# Patient Record
Sex: Male | Born: 1975 | Race: White | Hispanic: No | Marital: Married | Smoking: Never smoker
Health system: Southern US, Community
[De-identification: ages and names within clinical notes are randomized; demographics above are authoritative.]

## PROBLEM LIST (undated history)

## (undated) DIAGNOSIS — T783XXA Angioneurotic edema, initial encounter: Secondary | ICD-10-CM

## (undated) HISTORY — PX: BACK SURGERY: SHX140

## (undated) HISTORY — DX: Angioneurotic edema, initial encounter: T78.3XXA

---

## 2005-10-25 ENCOUNTER — Ambulatory Visit: Payer: Self-pay | Admitting: Family Medicine

## 2008-06-04 ENCOUNTER — Ambulatory Visit: Payer: Self-pay | Admitting: Family Medicine

## 2008-06-30 ENCOUNTER — Encounter (INDEPENDENT_AMBULATORY_CARE_PROVIDER_SITE_OTHER): Payer: Self-pay | Admitting: Neurosurgery

## 2008-06-30 ENCOUNTER — Inpatient Hospital Stay (HOSPITAL_COMMUNITY): Admission: RE | Admit: 2008-06-30 | Discharge: 2008-07-04 | Payer: Self-pay | Admitting: Neurosurgery

## 2008-07-24 ENCOUNTER — Ambulatory Visit: Payer: Self-pay | Admitting: Neurosurgery

## 2008-07-25 ENCOUNTER — Ambulatory Visit: Payer: Self-pay | Admitting: Neurosurgery

## 2008-12-25 ENCOUNTER — Ambulatory Visit: Payer: Self-pay | Admitting: Neurosurgery

## 2008-12-26 ENCOUNTER — Ambulatory Visit: Payer: Self-pay | Admitting: Neurosurgery

## 2009-05-11 ENCOUNTER — Ambulatory Visit: Payer: Self-pay | Admitting: Internal Medicine

## 2009-12-03 ENCOUNTER — Ambulatory Visit: Payer: Self-pay | Admitting: Family Medicine

## 2009-12-14 ENCOUNTER — Ambulatory Visit: Payer: Self-pay | Admitting: Neurosurgery

## 2009-12-17 ENCOUNTER — Ambulatory Visit: Payer: Self-pay | Admitting: Neurosurgery

## 2010-02-08 ENCOUNTER — Ambulatory Visit: Payer: Self-pay | Admitting: Rheumatology

## 2010-05-17 ENCOUNTER — Ambulatory Visit: Payer: Self-pay | Admitting: Neurosurgery

## 2010-05-31 ENCOUNTER — Ambulatory Visit (HOSPITAL_COMMUNITY): Admission: RE | Admit: 2010-05-31 | Discharge: 2010-06-01 | Payer: Self-pay | Admitting: Neurosurgery

## 2010-06-04 ENCOUNTER — Emergency Department: Payer: Self-pay | Admitting: Emergency Medicine

## 2010-09-17 ENCOUNTER — Ambulatory Visit: Payer: Self-pay | Admitting: Orthopedic Surgery

## 2010-09-28 ENCOUNTER — Ambulatory Visit: Payer: Self-pay | Admitting: Internal Medicine

## 2010-11-18 ENCOUNTER — Ambulatory Visit: Payer: Self-pay | Admitting: Neurosurgery

## 2010-11-20 ENCOUNTER — Encounter: Payer: Self-pay | Admitting: Orthopedic Surgery

## 2011-01-15 LAB — CBC
MCH: 30.2 pg (ref 26.0–34.0)
MCV: 89.2 fL (ref 78.0–100.0)
Platelets: 238 10*3/uL (ref 150–400)
RDW: 12.1 % (ref 11.5–15.5)

## 2011-01-15 LAB — TYPE AND SCREEN
ABO/RH(D): O POS
Antibody Screen: NEGATIVE

## 2011-01-15 LAB — DIFFERENTIAL
Basophils Absolute: 0.1 10*3/uL (ref 0.0–0.1)
Eosinophils Absolute: 0 10*3/uL (ref 0.0–0.7)
Eosinophils Relative: 0 % (ref 0–5)

## 2011-03-15 NOTE — Op Note (Signed)
Norman Thompson, Norman Thompson                  ACCOUNT NO.:  [id number]   MEDICAL RECORD NO.:  [id number]          PATIENT TYPE:  OIB   LOCATION:  3020                         FACILITY:  [facility]   PHYSICIAN:  Kathaleen Maser. Pool, M.D.    DATE OF BIRTH:  [birth year]   DATE OF PROCEDURE:  06/30/2008  DATE OF DISCHARGE:                               OPERATIVE REPORT   PREOPERATIVE DIAGNOSIS:  L2 intradural nerve sheath tumor.   POSTOPERATIVE DIAGNOSIS:  L2 intradural nerve sheath tumor.   PROCEDURE NAME:  L1-L2 laminectomy with resection of intradural  neoplasm, microdissection.   SURGEON:  Kathaleen Maser. Pool, MD   ASSISTANT:  Donalee Citrin, MD   ANESTHESIA:  General endotracheal.   INDICATIONS:  [name] is a 35 year old male with a history of a  previous lumbar nerve sheath tumor resection done approximately 8 years  ago in outside hospital.  The patient has been studied with followup  scans, which have all been free from any recurrent disease.  Over the  past 6 months, the patient has had some intermittent pain into his left  groin, particularly with turning towards the right.  This intermittently  has caused his left leg to go away and caused him to fall.  Recent  repeat studies with an MRI scan demonstrates a new appearance of an L2  nerve sheath tumor extending into the proximal aspect of the nerve root  sleeve.  This was felt to be consistent with a nerve sheath tumor and we  discussed options and decided to proceed with surgical resection.   OPERATIVE NOTE:  The patient was placed on the operative table in a  supine position.  After adequate level of anesthesia was achieved, the  patient was prone onto Wilson frame, firmly padded, the patient's lumbar  region was prepped and draped sterilely.  A #10 blade was used to make a  curvilinear skin incision overlying the L1-L2 interspace where this was  carried down sharply in midline.  A subperiosteal dissection was then  performed exposing the lamina and  facet joints of L1 and L2.  Deep self-  retaining retractor was placed.  Intraoperative x-ray was taken and the  level was confirmed.  Decompressive laminectomy was then performed using  Leksell rongeurs, Kerrison rongeurs, and high-speed drill to remove the  inferior half of the lamina of L1 and the entire L2 lamina.  Ligament  flavum was then elevated and resected in a piecemeal fashion using  Kerrison rongeurs.  Microscope was then brought into the field.  Using  microdissection of the cauda equina and nerve sheath tumor.  A midline  durotomy was then made with a 15 blade.  Dural edges were held in place  with traction sutures.  CSF was allowed to drain.  Using  microdissection, the arachnoid was divided and resected from overlying  the cauda equina.  The nerve sheath tumor was encountered, attached to  the exiting L2 nerve root with.  The tumor itself was brown and friable.  This was gently dissected from the nerve rootlets sparing the rootlets  completely.  Specimens were taken for pathology.  The tumor was resected  in gross total fashion.  There was no evidence of any residual disease.  Wound was then irrigated with antibiotic solution.  Dura was then  reapproximated with 4-0 Nurolon in a running fashion.  Gelfoam and  Tisseel were placed over the dural repair.  Wound was then closed in  layers with Vicryl sutures and then nylon suture on skin in a running  interlocking vertical mattress fashion.  Sterile dressing was applied.  There are no complications.  The patient tolerated the procedure well  and he returned to recovery room postoperatively.            ______________________________  Kathaleen Maser Pool, M.D.     HAP/MEDQ  D:  06/30/2008  T:  07/01/2008  Job:  [id number]

## 2011-03-18 NOTE — Discharge Summary (Signed)
NAMEKAMONTE, MCMICHEN                  ACCOUNT NO.:  [id number]   MEDICAL RECORD NO.:  [id number]          PATIENT TYPE:  INP   LOCATION:  3020                         FACILITY:  [facility]   PHYSICIAN:  Kathaleen Maser. Pool, M.D.    DATE OF BIRTH:  [birth year]   DATE OF ADMISSION:  06/30/2008  DATE OF DISCHARGE:  07/04/2008                               DISCHARGE SUMMARY   FINAL DIAGNOSIS:  Recurrent lumbar myxopapillary ependymoma.   OPERATIONS AND TREATMENTS:  Laminectomy with resection of intradural  tumor.   HISTORY OF PRESENT ILLNESS:  Mr. Rakestraw is a 35 year old male who has  remote history of a previous L4 laminectomy with resection of an  intradural tumor.  Pathology is unclear.  My understanding is that it  was represented a myxopapillary ependymoma at that time.  The patient  presents now with a new lesion at L2.  He presents now for laminectomy  and resection.   HOSPITAL COURSE:  The patient was taken to the operating room where  uncomplicated laminectomy and resection of this tumor was performed.  Pathology was consistent with a recurrent myxopapillary ependymoma.  Gross total resection was achieved at the time of surgery.  There was no  evidence of other disease at the time of surgery.  The patient was kept  at flat bed rest for 3 days.  He was then mobilized.  At the time of the  mobilization, he was neurologically intact.  He had minimal back pain.  He had no lower extremity pain.  His wound is healing well.  He is ready  for discharge home.  Condition on discharge is improved.   Plan is for screening MRI scans of his neural access as an outpatient to  stay to his recurrent disease and figure out the best for further  treatment for him.   Discharge followup is in 1 week in my office.           ______________________________  Kathaleen Maser. Pool, M.D.     HAP/MEDQ  D:  08/06/2008  T:  08/06/2008  Job:  [id number]

## 2012-04-28 ENCOUNTER — Emergency Department: Payer: Self-pay | Admitting: *Deleted

## 2012-08-14 ENCOUNTER — Ambulatory Visit: Payer: Self-pay | Admitting: Internal Medicine

## 2013-02-08 ENCOUNTER — Other Ambulatory Visit: Payer: Self-pay | Admitting: Neurosurgery

## 2013-02-08 DIAGNOSIS — M549 Dorsalgia, unspecified: Secondary | ICD-10-CM

## 2013-02-18 ENCOUNTER — Ambulatory Visit: Payer: Self-pay | Admitting: Neurosurgery

## 2013-03-23 ENCOUNTER — Emergency Department: Payer: Self-pay | Admitting: Emergency Medicine

## 2013-03-23 LAB — COMPREHENSIVE METABOLIC PANEL
Albumin: 4.1 g/dL (ref 3.4–5.0)
Alkaline Phosphatase: 66 U/L (ref 50–136)
Anion Gap: 4 — ABNORMAL LOW (ref 7–16)
Calcium, Total: 8.9 mg/dL (ref 8.5–10.1)
Co2: 27 mmol/L (ref 21–32)
EGFR (African American): >60
Glucose: 86 mg/dL (ref 65–99)
Osmolality: 280 (ref 275–301)
Potassium: 4.3 mmol/L (ref 3.5–5.1)
SGPT (ALT): 34 U/L (ref 12–78)

## 2013-03-23 LAB — CBC
HCT: 47.2 % (ref 40.0–52.0)
RBC: 5.6 10*6/uL (ref 4.40–5.90)

## 2014-12-19 DIAGNOSIS — M549 Dorsalgia, unspecified: Secondary | ICD-10-CM | POA: Diagnosis not present

## 2014-12-19 DIAGNOSIS — I10 Essential (primary) hypertension: Secondary | ICD-10-CM | POA: Diagnosis not present

## 2014-12-19 DIAGNOSIS — G8929 Other chronic pain: Secondary | ICD-10-CM | POA: Diagnosis not present

## 2014-12-19 DIAGNOSIS — G479 Sleep disorder, unspecified: Secondary | ICD-10-CM | POA: Diagnosis not present

## 2015-01-27 ENCOUNTER — Emergency Department: Payer: Self-pay | Admitting: Emergency Medicine

## 2015-01-27 DIAGNOSIS — S161XXA Strain of muscle, fascia and tendon at neck level, initial encounter: Secondary | ICD-10-CM | POA: Diagnosis not present

## 2015-01-27 DIAGNOSIS — Z79899 Other long term (current) drug therapy: Secondary | ICD-10-CM | POA: Diagnosis not present

## 2015-01-27 DIAGNOSIS — S29012A Strain of muscle and tendon of back wall of thorax, initial encounter: Secondary | ICD-10-CM | POA: Diagnosis not present

## 2015-02-06 DIAGNOSIS — D497 Neoplasm of unspecified behavior of endocrine glands and other parts of nervous system: Secondary | ICD-10-CM | POA: Diagnosis not present

## 2015-02-18 ENCOUNTER — Ambulatory Visit
Admit: 2015-02-18 | Disposition: A | Discharge status: Home / Self Care | Payer: Self-pay | Attending: Neurosurgery | Admitting: Neurosurgery

## 2015-02-18 DIAGNOSIS — M545 Low back pain: Secondary | ICD-10-CM | POA: Diagnosis not present

## 2015-02-18 DIAGNOSIS — D497 Neoplasm of unspecified behavior of endocrine glands and other parts of nervous system: Secondary | ICD-10-CM | POA: Diagnosis not present

## 2015-02-26 DIAGNOSIS — D497 Neoplasm of unspecified behavior of endocrine glands and other parts of nervous system: Secondary | ICD-10-CM | POA: Diagnosis not present

## 2015-02-26 DIAGNOSIS — Z6825 Body mass index (BMI) 25.0-25.9, adult: Secondary | ICD-10-CM | POA: Diagnosis not present

## 2015-02-26 DIAGNOSIS — I10 Essential (primary) hypertension: Secondary | ICD-10-CM | POA: Diagnosis not present

## 2015-03-23 DIAGNOSIS — M545 Low back pain: Secondary | ICD-10-CM | POA: Diagnosis not present

## 2015-03-23 DIAGNOSIS — G8929 Other chronic pain: Secondary | ICD-10-CM | POA: Diagnosis not present

## 2015-06-03 ENCOUNTER — Encounter: Payer: Self-pay | Admitting: Anesthesiology

## 2015-06-03 ENCOUNTER — Encounter (INDEPENDENT_AMBULATORY_CARE_PROVIDER_SITE_OTHER): Payer: Self-pay

## 2015-06-03 ENCOUNTER — Ambulatory Visit: Payer: Commercial Managed Care - HMO | Attending: Anesthesiology | Admitting: Anesthesiology

## 2015-06-03 VITALS — BP 144/96 | HR 123 | Temp 98.6°F | Resp 16 | Ht 68.0 in | Wt 165.0 lb

## 2015-06-03 DIAGNOSIS — M545 Low back pain, unspecified: Secondary | ICD-10-CM

## 2015-06-03 DIAGNOSIS — F1120 Opioid dependence, uncomplicated: Secondary | ICD-10-CM | POA: Diagnosis not present

## 2015-06-03 DIAGNOSIS — M51369 Other intervertebral disc degeneration, lumbar region without mention of lumbar back pain or lower extremity pain: Secondary | ICD-10-CM

## 2015-06-03 DIAGNOSIS — M5136 Other intervertebral disc degeneration, lumbar region: Secondary | ICD-10-CM | POA: Diagnosis not present

## 2015-06-03 DIAGNOSIS — Z79891 Long term (current) use of opiate analgesic: Secondary | ICD-10-CM | POA: Diagnosis not present

## 2015-06-03 DIAGNOSIS — Z9889 Other specified postprocedural states: Secondary | ICD-10-CM

## 2015-06-03 DIAGNOSIS — M5442 Lumbago with sciatica, left side: Secondary | ICD-10-CM | POA: Diagnosis not present

## 2015-06-03 DIAGNOSIS — M5416 Radiculopathy, lumbar region: Secondary | ICD-10-CM | POA: Diagnosis not present

## 2015-06-03 DIAGNOSIS — G8929 Other chronic pain: Secondary | ICD-10-CM | POA: Diagnosis not present

## 2015-06-03 DIAGNOSIS — M25562 Pain in left knee: Secondary | ICD-10-CM | POA: Diagnosis not present

## 2015-06-03 DIAGNOSIS — G894 Chronic pain syndrome: Secondary | ICD-10-CM | POA: Diagnosis not present

## 2015-06-03 DIAGNOSIS — M25552 Pain in left hip: Secondary | ICD-10-CM | POA: Diagnosis not present

## 2015-06-03 DIAGNOSIS — Z79899 Other long term (current) drug therapy: Secondary | ICD-10-CM | POA: Diagnosis not present

## 2015-06-03 DIAGNOSIS — M25561 Pain in right knee: Secondary | ICD-10-CM | POA: Diagnosis not present

## 2015-06-03 MED ORDER — OXYCODONE-ACETAMINOPHEN 5-325 MG PO TABS: 1.0000 | ORAL_TABLET | Freq: Four times a day (QID) | ORAL | Status: DC | PRN
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
## 2015-06-03 NOTE — Patient Instructions (Signed)
Epidural Steroid Injection Patient Information  Description: The epidural space surrounds the nerves as they exit the spinal cord.  In some patients, the nerves can be compressed and inflamed by a bulging disc or a tight spinal canal (spinal stenosis).  By injecting steroids into the epidural space, we can bring irritated nerves into direct contact with a potentially helpful medication.  These steroids act directly on the irritated nerves and can reduce swelling and inflammation which often leads to decreased pain.  Epidural steroids may be injected anywhere along the spine and from the neck to the low back depending upon the location of your pain.   After numbing the skin with local anesthetic (like Novocaine), a small needle is passed into the epidural space slowly.  You may experience a sensation of pressure while this is being done.  The entire block usually last less than 10 minutes.  Conditions which may be treated by epidural steroids:   Low back and leg pain  Neck and arm pain  Spinal stenosis  Post-laminectomy syndrome  Herpes zoster (shingles) pain  Pain from compression fractures  Preparation for the injection:  1. Do not eat any solid food or dairy products within 6 hours of your appointment.  2. You may drink clear liquids up to 2 hours before appointment.  Clear liquids include water, black coffee, juice or soda.  No milk or cream please. 3. You may take your regular medication, including pain medications, with a sip of water before your appointment  Diabetics should hold regular insulin (if taken separately) and take 1/2 normal NPH dos the morning of the procedure.  Carry some sugar containing items with you to your appointment. 4. A driver must accompany you and be prepared to drive you home after your procedure.  5. Bring all your current medications with your. 6. An IV may be inserted and sedation may be given at the discretion of the physician.   7. A blood pressure  cuff, EKG and other monitors will often be applied during the procedure.  Some patients may need to have extra oxygen administered for a short period. 8. You will be asked to provide medical information, including your allergies, prior to the procedure.  We must know immediately if you are taking blood thinners (like Coumadin/Warfarin)  Or if you are allergic to IV iodine contrast (dye). We must know if you could possible be pregnant.  Possible side-effects:  Bleeding from needle site  Infection (rare, may require surgery)  Nerve injury (rare)  Numbness & tingling (temporary)  Difficulty urinating (rare, temporary)  Spinal headache ( a headache worse with upright posture)  Light -headedness (temporary)  Pain at injection site (several days)  Decreased blood pressure (temporary)  Weakness in arm/leg (temporary)  Pressure sensation in back/neck (temporary)  Call if you experience:  Fever/chills associated with headache or increased back/neck pain.  Headache worsened by an upright position.  New onset weakness or numbness of an extremity below the injection site  Hives or difficulty breathing (go to the emergency room)  Inflammation or drainage at the infection site  Severe back/neck pain  Any new symptoms which are concerning to you  Please note:  Although the local anesthetic injected can often make your back or neck feel good for several hours after the injection, the pain will likely return.  It takes 3-7 days for steroids to work in the epidural space.  You may not notice any pain relief for at least that one week.    If effective, we will often do a series of three injections spaced 3-6 weeks apart to maximally decrease your pain.  After the initial series, we generally will wait several months before considering a repeat injection of the same type.  If you have any questions, please call [phone] [place] were given a  script for Oxycodone today.

## 2015-06-03 NOTE — Progress Notes (Signed)
Safety precautions to be maintained throughout the outpatient stay will include: orient to surroundings, keep bed in low position, maintain call bell within reach at all times, provide assistance with transfer out of bed and ambulation.  

## 2015-06-16 ENCOUNTER — Other Ambulatory Visit: Payer: Self-pay | Admitting: Anesthesiology

## 2015-07-10 ENCOUNTER — Ambulatory Visit: Payer: Self-pay | Admitting: Anesthesiology

## 2015-07-10 ENCOUNTER — Ambulatory Visit: Payer: Commercial Managed Care - HMO | Attending: Anesthesiology | Admitting: Anesthesiology

## 2015-07-10 ENCOUNTER — Encounter: Payer: Self-pay | Admitting: Anesthesiology

## 2015-07-10 VITALS — BP 111/81 | HR 85 | Temp 98.5°F | Resp 16 | Ht 68.0 in | Wt 165.0 lb

## 2015-07-10 DIAGNOSIS — M5442 Lumbago with sciatica, left side: Secondary | ICD-10-CM | POA: Diagnosis not present

## 2015-07-10 DIAGNOSIS — M545 Low back pain, unspecified: Secondary | ICD-10-CM

## 2015-07-10 DIAGNOSIS — M25562 Pain in left knee: Secondary | ICD-10-CM | POA: Diagnosis not present

## 2015-07-10 DIAGNOSIS — M5137 Other intervertebral disc degeneration, lumbosacral region: Secondary | ICD-10-CM

## 2015-07-10 DIAGNOSIS — G8929 Other chronic pain: Secondary | ICD-10-CM | POA: Diagnosis not present

## 2015-07-10 DIAGNOSIS — M25561 Pain in right knee: Secondary | ICD-10-CM | POA: Diagnosis not present

## 2015-07-10 DIAGNOSIS — Z79891 Long term (current) use of opiate analgesic: Secondary | ICD-10-CM | POA: Diagnosis not present

## 2015-07-10 DIAGNOSIS — Z9889 Other specified postprocedural states: Secondary | ICD-10-CM

## 2015-07-10 DIAGNOSIS — M5416 Radiculopathy, lumbar region: Secondary | ICD-10-CM

## 2015-07-10 DIAGNOSIS — M5116 Intervertebral disc disorders with radiculopathy, lumbar region: Secondary | ICD-10-CM | POA: Diagnosis not present

## 2015-07-10 DIAGNOSIS — M25552 Pain in left hip: Secondary | ICD-10-CM | POA: Diagnosis not present

## 2015-07-10 MED ORDER — OXYCODONE-ACETAMINOPHEN 5-325 MG PO TABS: 1.0000 | ORAL_TABLET | Freq: Four times a day (QID) | ORAL | Status: DC | PRN

## 2015-07-10 NOTE — Patient Instructions (Signed)

## 2015-07-10 NOTE — Progress Notes (Signed)
Given by Dr. Lance Bosch  At 1047 22ml 1% xylocaine 1048am 45ml 25% sensocaine   1048am 80 mg  Kenalog  10437ml omnipaque  Caudal epidural i gave 2mg  of versed iv

## 2015-07-10 NOTE — Progress Notes (Signed)
Safety precautions to be maintained throughout the outpatient stay will include: orient to surroundings, keep bed in low position, maintain call bell within reach at all times, provide assistance with transfer out of bed and ambulation.  

## 2015-07-10 NOTE — Progress Notes (Signed)
   Subjective:    Patient ID: Norman Thompson, male    DOB: 1976/07/15, 39 y.o.   MRN: [id number]  HPI    Review of Systems     Objective:   Physical Exam        Assessment & Plan:   Caudal epidural steroid injection  Date of procedure 9th of September 2016  Preoperative diagnosis  Back pain at L4-L5 level - Primary    ICD-9-CM: 724.2 ICD-10-CM: M54.5    DDD (degenerative disc disease), lumbar     ICD-9-CM: 722.52 ICD-10-CM: M51.36    Lumbar radiculopathy     ICD-9-CM: 724.4 ICD-10-CM: M54.16    Status post spinal surgery     ICD-9-CM: V45.89 ICD-10-CM: Z98.89        Postoperative diagnosis Same  Procedure 1 Caudal epidural steroid injection 2 epidurogram with interpretation 3 fluoroscopic guidance  Surgeon Lance Bosch M.D.  Anesthesia  Mac anesthesia under my direction  Informed consent was obtained and the patient appeared to accept and understand the benefits and risks of this procedure Timeout was done in conjunction with the nursing staff to verified the site of the procedure  Description of procedure The patient was taken to the procedure room and intravenous sedation and Mac anesthesia was provided under my direction After appropriate sedation the sacrococcygeal area was prepped with Betadine. After appropriate draping the area between the sacral cornua was palpated and infiltrated with 3 cc of 1% lidocaine.  An AP fluoroscopic view of the sacrum was visualized and a 17-gauge two-way needle was inserted in the midline at an angle of 45 through the sacrococcygeal membrane. After making contact with the bone the needle was withdrawn and readvanced in a horizontal position into the caudal epidural space.  Epidurogram study 1 cc of Omnipaque 180 was injected through the needle and an epidurogram was visualized in both lateral and AP views. The spread of the contrast was clearly seen bilaterally at the L3-L4 and L5  levels. There was no evidence of any intravascular or intrathecal spread. No catheter was used  Caudal epidural steroid injection Then 10 cc of 0.25% bupivacaine and 80 mg of Kenalog were injected into the caudal epidural space. The needle was removed and adequate hemostasis was established. The patient tolerated procedure quite well and vital signs were stable. There were no adverse effects Patient was taken to the recovery room in satisfactory condition where he was observed for approximately 30 minutes and subsequently discharged home.  Will follow-up with this patient in the next 2 weeks.   Patient was given a refill of his oxycodone with acetaminophen 5/325 one tablet every 6 hours and he was given was 56 tablets   Lance Bosch M.D.

## 2015-07-13 ENCOUNTER — Telehealth: Payer: Self-pay | Admitting: *Deleted

## 2015-07-13 NOTE — Telephone Encounter (Signed)
Post procedure phone call, no problems

## 2015-07-22 ENCOUNTER — Encounter: Payer: Self-pay | Admitting: Anesthesiology

## 2015-07-22 ENCOUNTER — Ambulatory Visit: Payer: Self-pay | Admitting: Anesthesiology

## 2015-07-22 ENCOUNTER — Ambulatory Visit: Payer: Commercial Managed Care - HMO | Attending: Anesthesiology | Admitting: Anesthesiology

## 2015-07-22 VITALS — BP 147/88 | HR 89 | Temp 98.3°F | Resp 16 | Ht 68.0 in | Wt 165.0 lb

## 2015-07-22 DIAGNOSIS — M5136 Other intervertebral disc degeneration, lumbar region: Secondary | ICD-10-CM | POA: Insufficient documentation

## 2015-07-22 DIAGNOSIS — M25562 Pain in left knee: Secondary | ICD-10-CM | POA: Diagnosis not present

## 2015-07-22 DIAGNOSIS — M5416 Radiculopathy, lumbar region: Secondary | ICD-10-CM | POA: Insufficient documentation

## 2015-07-22 DIAGNOSIS — Z9889 Other specified postprocedural states: Secondary | ICD-10-CM | POA: Insufficient documentation

## 2015-07-22 DIAGNOSIS — G8929 Other chronic pain: Secondary | ICD-10-CM | POA: Diagnosis not present

## 2015-07-22 DIAGNOSIS — M25552 Pain in left hip: Secondary | ICD-10-CM | POA: Diagnosis not present

## 2015-07-22 DIAGNOSIS — M51379 Other intervertebral disc degeneration, lumbosacral region without mention of lumbar back pain or lower extremity pain: Secondary | ICD-10-CM | POA: Insufficient documentation

## 2015-07-22 DIAGNOSIS — M545 Low back pain, unspecified: Secondary | ICD-10-CM | POA: Insufficient documentation

## 2015-07-22 DIAGNOSIS — Z79891 Long term (current) use of opiate analgesic: Secondary | ICD-10-CM | POA: Diagnosis not present

## 2015-07-22 DIAGNOSIS — M5137 Other intervertebral disc degeneration, lumbosacral region: Secondary | ICD-10-CM

## 2015-07-22 DIAGNOSIS — M5442 Lumbago with sciatica, left side: Secondary | ICD-10-CM | POA: Diagnosis not present

## 2015-07-22 DIAGNOSIS — M25561 Pain in right knee: Secondary | ICD-10-CM | POA: Diagnosis not present

## 2015-07-22 NOTE — Progress Notes (Signed)
Subjective:     Mr. Norman Thompson returns to the clinic today indicating that he got about 1-2 days pain relief from the last caudal epidural steroid injection He indicates that today his subjective pain intensity rating is approximately 70% He states that he had a fall on Sunday but this was not new since he has been falling for some time. Patient indicated that he's had lumbar spinal surgery in the past and his back pain continues with radiation down his left leg The pain enters into his left foot and involves all 5 toes suggesting that L4-L5 and S1 are all involved.  Patient ID: Norman Thompson, male    DOB: [birth year], 39 y.o.   MRN: [id number]  HPI    Review of Systems  Constitutional: Negative.   HENT: Negative.   Eyes: Negative.   Respiratory: Negative.   Cardiovascular: Negative.   Gastrointestinal: Negative.   Endocrine: Negative.   Genitourinary: Negative.   Musculoskeletal: Negative.        The patient continues to have pain going from the back into his left leg down his thigh into his lower leg and ended up in his left foot and affects all 5 toes Range of motion is significantly decreased in the left leg  Skin: Negative.   Allergic/Immunologic: Negative.   Hematological: Negative.   Psychiatric/Behavioral: Negative.        Objective:   Physical Exam  Constitutional: He appears well-developed and well-nourished. No distress.  HENT:  Head: Normocephalic and atraumatic.  Right Ear: External ear normal.  Left Ear: External ear normal.  Nose: Nose normal.  Mouth/Throat: No oropharyngeal exudate.  Eyes: Conjunctivae and EOM are normal. Pupils are equal, round, and reactive to light. Right eye exhibits no discharge. Left eye exhibits no discharge. No scleral icterus.  Neck: Normal range of motion. Neck supple. No JVD present. No tracheal deviation present. No thyromegaly present.  Cardiovascular: Normal rate, regular rhythm, normal heart sounds and intact distal pulses.   Exam reveals no friction rub.   No murmur heard. His blood pressure is 147/88 mmHg His pulse is 89 bpm equal and regular Heart sounds 1 and 2 were heard in all areas and there were no audible murmurs The temperature is 98.3C Respirations are 16 breaths per minute Chest is clinically clear There are no adventitious sounds S PO2 is 99%  Pulmonary/Chest: Effort normal and breath sounds normal. No respiratory distress. He has no wheezes. He has no rales. He exhibits no tenderness.  Abdominal: Soft. Bowel sounds are normal. He exhibits no distension and no mass. There is no tenderness. There is no rebound and no guarding.  Genitourinary:  Genitourinary exam was deferred  Musculoskeletal: He exhibits no edema or tenderness.  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  Neurological: He is alert. He has normal reflexes. No cranial nerve deficit. He exhibits normal muscle tone. Coordination normal.  Skin: Skin is warm. No rash noted. He is not diaphoretic. No erythema. No pallor.  Psychiatric: He has a normal mood and affect. His behavior is normal. Judgment and thought content normal.  Nursing note reviewed.         Assessment & Plan:    Assessment 1 chronic  low back pain 2 lumbar degenerative disc disease 3 lumbar radiculopathy 4 failed back surgery syndrome   Established patient       level III   Lance Bosch M.D.

## 2015-07-22 NOTE — Progress Notes (Signed)
   Subjective:    Patient ID: Norman Thompson, male    DOB: August 29, 1976, 39 y.o.   MRN: [id number]  HPI    Review of Systems     Objective:   Physical Exam        Assessment & Plan:    Plan of management We'll plan to perform a left lumbar transforaminal epidural steroid injection at L4 L5 S1 for this patient on his next visit   Lance Bosch M.D.

## 2015-07-22 NOTE — Patient Instructions (Signed)
Epidural Steroid Injection Patient Information  Description: The epidural space surrounds the nerves as they exit the spinal cord.  In some patients, the nerves can be compressed and inflamed by a bulging disc or a tight spinal canal (spinal stenosis).  By injecting steroids into the epidural space, we can bring irritated nerves into direct contact with a potentially helpful medication.  These steroids act directly on the irritated nerves and can reduce swelling and inflammation which often leads to decreased pain.  Epidural steroids may be injected anywhere along the spine and from the neck to the low back depending upon the location of your pain.   After numbing the skin with local anesthetic (like Novocaine), a small needle is passed into the epidural space slowly.  You may experience a sensation of pressure while this is being done.  The entire block usually last less than 10 minutes.  Conditions which may be treated by epidural steroids:   Low back and leg pain  Neck and arm pain  Spinal stenosis  Post-laminectomy syndrome  Herpes zoster (shingles) pain  Pain from compression fractures  Preparation for the injection:  1. Do not eat any solid food or dairy products within 6 hours of your appointment.  2. You may drink clear liquids up to 2 hours before appointment.  Clear liquids include water, black coffee, juice or soda.  No milk or cream please. 3. You may take your regular medication, including pain medications, with a sip of water before your appointment  Diabetics should hold regular insulin (if taken separately) and take 1/2 normal NPH dos the morning of the procedure.  Carry some sugar containing items with you to your appointment. 4. A driver must accompany you and be prepared to drive you home after your procedure.  5. Bring all your current medications with your. 6. An IV may be inserted and sedation may be given at the discretion of the physician.   7. A blood pressure  cuff, EKG and other monitors will often be applied during the procedure.  Some patients may need to have extra oxygen administered for a short period. 8. You will be asked to provide medical information, including your allergies, prior to the procedure.  We must know immediately if you are taking blood thinners (like Coumadin/Warfarin)  Or if you are allergic to IV iodine contrast (dye). We must know if you could possible be pregnant.  Possible side-effects:  Bleeding from needle site  Infection (rare, may require surgery)  Nerve injury (rare)  Numbness & tingling (temporary)  Difficulty urinating (rare, temporary)  Spinal headache ( a headache worse with upright posture)  Light -headedness (temporary)  Pain at injection site (several days)  Decreased blood pressure (temporary)  Weakness in arm/leg (temporary)  Pressure sensation in back/neck (temporary)  Call if you experience:  Fever/chills associated with headache or increased back/neck pain.  Headache worsened by an upright position.  New onset weakness or numbness of an extremity below the injection site  Hives or difficulty breathing (go to the emergency room)  Inflammation or drainage at the infection site  Severe back/neck pain  Any new symptoms which are concerning to you  Please note:  Although the local anesthetic injected can often make your back or neck feel good for several hours after the injection, the pain will likely return.  It takes 3-7 days for steroids to work in the epidural space.  You may not notice any pain relief for at least that one week.    If effective, we will often do a series of three injections spaced 3-6 weeks apart to maximally decrease your pain.  After the initial series, we generally will wait several months before considering a repeat injection of the same type.  If you have any questions, please call [phone] [facility] 

## 2015-08-13 ENCOUNTER — Telehealth: Payer: Self-pay

## 2015-08-19 NOTE — Telephone Encounter (Signed)
-----   Message from [place], [place] sent at 08/13/2015  3:24 PM EDT ----- Regarding: Norman Thompson [facility] PT Pt called and said he was out of his meds and he would like to come in tomorrow to pick up his prescription... He would like Dr. Idelia Salm to write him a letter talking about the procedure that he will be having soon (left sided caudal) percutaneous neuroplasty

## 2015-08-19 NOTE — Telephone Encounter (Signed)
Spoke with Dr. Idelia Salm, will not write the letter the patient requested. Attempted to call patient, message left.

## 2015-08-20 ENCOUNTER — Telehealth: Payer: Self-pay | Admitting: Anesthesiology

## 2015-08-20 NOTE — Telephone Encounter (Signed)
Has disability hearing coming up and needs letter that he discussed with Coralyn Helling addressed to Disability Determination Board / please call him when it is ready so he can get it/ his hearing is Friday 28th [phone]

## 2015-08-31 ENCOUNTER — Telehealth: Payer: Self-pay

## 2015-08-31 NOTE — Telephone Encounter (Signed)
Attempted to call patient, message left. 

## 2015-08-31 NOTE — Telephone Encounter (Signed)
Procedure cancelled but needs medication asap. He is out. Please call

## 2015-09-01 NOTE — Telephone Encounter (Signed)
Spoke with patient, states "I'm not going to pay $45 to get a prescription." He then hung up.

## 2015-09-04 ENCOUNTER — Ambulatory Visit: Payer: Self-pay | Admitting: Anesthesiology

## 2015-09-21 DIAGNOSIS — Z13220 Encounter for screening for lipoid disorders: Secondary | ICD-10-CM | POA: Diagnosis not present

## 2015-09-21 DIAGNOSIS — Z131 Encounter for screening for diabetes mellitus: Secondary | ICD-10-CM | POA: Diagnosis not present

## 2015-09-21 DIAGNOSIS — G894 Chronic pain syndrome: Secondary | ICD-10-CM | POA: Diagnosis not present

## 2015-09-21 DIAGNOSIS — Z125 Encounter for screening for malignant neoplasm of prostate: Secondary | ICD-10-CM | POA: Diagnosis not present

## 2015-09-21 DIAGNOSIS — Z0000 Encounter for general adult medical examination without abnormal findings: Secondary | ICD-10-CM | POA: Diagnosis not present

## 2015-09-22 DIAGNOSIS — Z13220 Encounter for screening for lipoid disorders: Secondary | ICD-10-CM | POA: Diagnosis not present

## 2015-09-22 DIAGNOSIS — Z125 Encounter for screening for malignant neoplasm of prostate: Secondary | ICD-10-CM | POA: Diagnosis not present

## 2015-09-22 DIAGNOSIS — Z131 Encounter for screening for diabetes mellitus: Secondary | ICD-10-CM | POA: Diagnosis not present

## 2015-11-24 DIAGNOSIS — C719 Malignant neoplasm of brain, unspecified: Secondary | ICD-10-CM | POA: Diagnosis not present

## 2015-11-24 DIAGNOSIS — M549 Dorsalgia, unspecified: Secondary | ICD-10-CM | POA: Diagnosis not present

## 2015-11-24 DIAGNOSIS — M961 Postlaminectomy syndrome, not elsewhere classified: Secondary | ICD-10-CM | POA: Diagnosis not present

## 2015-11-24 DIAGNOSIS — D497 Neoplasm of unspecified behavior of endocrine glands and other parts of nervous system: Secondary | ICD-10-CM | POA: Diagnosis not present

## 2015-11-24 DIAGNOSIS — M25569 Pain in unspecified knee: Secondary | ICD-10-CM | POA: Diagnosis not present

## 2015-11-24 DIAGNOSIS — Z79899 Other long term (current) drug therapy: Secondary | ICD-10-CM | POA: Diagnosis not present

## 2015-11-24 DIAGNOSIS — R102 Pelvic and perineal pain: Secondary | ICD-10-CM | POA: Diagnosis not present

## 2015-11-24 DIAGNOSIS — G894 Chronic pain syndrome: Secondary | ICD-10-CM | POA: Diagnosis not present

## 2015-11-27 DIAGNOSIS — B070 Plantar wart: Secondary | ICD-10-CM | POA: Diagnosis not present

## 2015-12-07 DIAGNOSIS — B070 Plantar wart: Secondary | ICD-10-CM | POA: Diagnosis not present

## 2016-01-04 DIAGNOSIS — M549 Dorsalgia, unspecified: Secondary | ICD-10-CM | POA: Diagnosis not present

## 2016-01-04 DIAGNOSIS — M25562 Pain in left knee: Secondary | ICD-10-CM | POA: Diagnosis not present

## 2016-01-04 DIAGNOSIS — D497 Neoplasm of unspecified behavior of endocrine glands and other parts of nervous system: Secondary | ICD-10-CM | POA: Diagnosis not present

## 2016-01-04 DIAGNOSIS — G894 Chronic pain syndrome: Secondary | ICD-10-CM | POA: Diagnosis not present

## 2016-01-04 DIAGNOSIS — B070 Plantar wart: Secondary | ICD-10-CM | POA: Diagnosis not present

## 2016-01-04 DIAGNOSIS — M961 Postlaminectomy syndrome, not elsewhere classified: Secondary | ICD-10-CM | POA: Diagnosis not present

## 2016-01-04 DIAGNOSIS — G8929 Other chronic pain: Secondary | ICD-10-CM | POA: Diagnosis not present

## 2016-01-04 DIAGNOSIS — C719 Malignant neoplasm of brain, unspecified: Secondary | ICD-10-CM | POA: Diagnosis not present

## 2016-01-04 DIAGNOSIS — M25561 Pain in right knee: Secondary | ICD-10-CM | POA: Diagnosis not present

## 2016-01-21 DIAGNOSIS — B070 Plantar wart: Secondary | ICD-10-CM | POA: Diagnosis not present

## 2016-02-03 DIAGNOSIS — M25561 Pain in right knee: Secondary | ICD-10-CM | POA: Diagnosis not present

## 2016-02-03 DIAGNOSIS — M961 Postlaminectomy syndrome, not elsewhere classified: Secondary | ICD-10-CM | POA: Diagnosis not present

## 2016-02-03 DIAGNOSIS — M549 Dorsalgia, unspecified: Secondary | ICD-10-CM | POA: Diagnosis not present

## 2016-02-03 DIAGNOSIS — G8929 Other chronic pain: Secondary | ICD-10-CM | POA: Diagnosis not present

## 2016-02-03 DIAGNOSIS — C719 Malignant neoplasm of brain, unspecified: Secondary | ICD-10-CM | POA: Diagnosis not present

## 2016-02-03 DIAGNOSIS — D497 Neoplasm of unspecified behavior of endocrine glands and other parts of nervous system: Secondary | ICD-10-CM | POA: Diagnosis not present

## 2016-02-03 DIAGNOSIS — G894 Chronic pain syndrome: Secondary | ICD-10-CM | POA: Diagnosis not present

## 2016-02-03 DIAGNOSIS — Z0289 Encounter for other administrative examinations: Secondary | ICD-10-CM | POA: Diagnosis not present

## 2016-02-03 DIAGNOSIS — M25562 Pain in left knee: Secondary | ICD-10-CM | POA: Diagnosis not present

## 2016-02-22 DIAGNOSIS — J0100 Acute maxillary sinusitis, unspecified: Secondary | ICD-10-CM | POA: Diagnosis not present

## 2016-03-29 DIAGNOSIS — C719 Malignant neoplasm of brain, unspecified: Secondary | ICD-10-CM | POA: Diagnosis not present

## 2016-03-29 DIAGNOSIS — M961 Postlaminectomy syndrome, not elsewhere classified: Secondary | ICD-10-CM | POA: Diagnosis not present

## 2016-03-29 DIAGNOSIS — M25561 Pain in right knee: Secondary | ICD-10-CM | POA: Diagnosis not present

## 2016-03-29 DIAGNOSIS — Z0289 Encounter for other administrative examinations: Secondary | ICD-10-CM | POA: Diagnosis not present

## 2016-03-29 DIAGNOSIS — M25562 Pain in left knee: Secondary | ICD-10-CM | POA: Diagnosis not present

## 2016-03-29 DIAGNOSIS — G894 Chronic pain syndrome: Secondary | ICD-10-CM | POA: Diagnosis not present

## 2016-03-29 DIAGNOSIS — G8929 Other chronic pain: Secondary | ICD-10-CM | POA: Diagnosis not present

## 2016-03-29 DIAGNOSIS — D497 Neoplasm of unspecified behavior of endocrine glands and other parts of nervous system: Secondary | ICD-10-CM | POA: Diagnosis not present

## 2016-03-29 DIAGNOSIS — M549 Dorsalgia, unspecified: Secondary | ICD-10-CM | POA: Diagnosis not present

## 2016-03-29 DIAGNOSIS — Z79891 Long term (current) use of opiate analgesic: Secondary | ICD-10-CM | POA: Diagnosis not present

## 2016-05-31 DIAGNOSIS — M25561 Pain in right knee: Secondary | ICD-10-CM | POA: Diagnosis not present

## 2016-05-31 DIAGNOSIS — M549 Dorsalgia, unspecified: Secondary | ICD-10-CM | POA: Diagnosis not present

## 2016-05-31 DIAGNOSIS — D497 Neoplasm of unspecified behavior of endocrine glands and other parts of nervous system: Secondary | ICD-10-CM | POA: Diagnosis not present

## 2016-05-31 DIAGNOSIS — Z0289 Encounter for other administrative examinations: Secondary | ICD-10-CM | POA: Diagnosis not present

## 2016-05-31 DIAGNOSIS — M961 Postlaminectomy syndrome, not elsewhere classified: Secondary | ICD-10-CM | POA: Diagnosis not present

## 2016-05-31 DIAGNOSIS — G894 Chronic pain syndrome: Secondary | ICD-10-CM | POA: Diagnosis not present

## 2016-05-31 DIAGNOSIS — G8929 Other chronic pain: Secondary | ICD-10-CM | POA: Diagnosis not present

## 2016-05-31 DIAGNOSIS — C719 Malignant neoplasm of brain, unspecified: Secondary | ICD-10-CM | POA: Diagnosis not present

## 2016-05-31 DIAGNOSIS — M25562 Pain in left knee: Secondary | ICD-10-CM | POA: Diagnosis not present

## 2016-08-31 DIAGNOSIS — M961 Postlaminectomy syndrome, not elsewhere classified: Secondary | ICD-10-CM | POA: Diagnosis not present

## 2016-08-31 DIAGNOSIS — G8929 Other chronic pain: Secondary | ICD-10-CM | POA: Diagnosis not present

## 2016-08-31 DIAGNOSIS — M25561 Pain in right knee: Secondary | ICD-10-CM | POA: Diagnosis not present

## 2016-08-31 DIAGNOSIS — M25562 Pain in left knee: Secondary | ICD-10-CM | POA: Diagnosis not present

## 2016-08-31 DIAGNOSIS — G894 Chronic pain syndrome: Secondary | ICD-10-CM | POA: Diagnosis not present

## 2016-08-31 DIAGNOSIS — Z0289 Encounter for other administrative examinations: Secondary | ICD-10-CM | POA: Diagnosis not present

## 2016-08-31 DIAGNOSIS — D497 Neoplasm of unspecified behavior of endocrine glands and other parts of nervous system: Secondary | ICD-10-CM | POA: Diagnosis not present

## 2016-08-31 DIAGNOSIS — M549 Dorsalgia, unspecified: Secondary | ICD-10-CM | POA: Diagnosis not present

## 2016-08-31 DIAGNOSIS — C719 Malignant neoplasm of brain, unspecified: Secondary | ICD-10-CM | POA: Diagnosis not present

## 2016-09-29 DIAGNOSIS — Z13220 Encounter for screening for lipoid disorders: Secondary | ICD-10-CM | POA: Diagnosis not present

## 2016-09-29 DIAGNOSIS — Z0000 Encounter for general adult medical examination without abnormal findings: Secondary | ICD-10-CM | POA: Diagnosis not present

## 2016-09-29 DIAGNOSIS — Z79899 Other long term (current) drug therapy: Secondary | ICD-10-CM | POA: Diagnosis not present

## 2016-09-29 DIAGNOSIS — Z125 Encounter for screening for malignant neoplasm of prostate: Secondary | ICD-10-CM | POA: Diagnosis not present

## 2016-11-30 DIAGNOSIS — M25562 Pain in left knee: Secondary | ICD-10-CM | POA: Diagnosis not present

## 2016-11-30 DIAGNOSIS — C719 Malignant neoplasm of brain, unspecified: Secondary | ICD-10-CM | POA: Diagnosis not present

## 2016-11-30 DIAGNOSIS — M961 Postlaminectomy syndrome, not elsewhere classified: Secondary | ICD-10-CM | POA: Diagnosis not present

## 2016-11-30 DIAGNOSIS — G8929 Other chronic pain: Secondary | ICD-10-CM | POA: Diagnosis not present

## 2016-11-30 DIAGNOSIS — M549 Dorsalgia, unspecified: Secondary | ICD-10-CM | POA: Diagnosis not present

## 2016-11-30 DIAGNOSIS — D497 Neoplasm of unspecified behavior of endocrine glands and other parts of nervous system: Secondary | ICD-10-CM | POA: Diagnosis not present

## 2016-11-30 DIAGNOSIS — G894 Chronic pain syndrome: Secondary | ICD-10-CM | POA: Diagnosis not present

## 2016-11-30 DIAGNOSIS — M25561 Pain in right knee: Secondary | ICD-10-CM | POA: Diagnosis not present

## 2017-02-15 ENCOUNTER — Ambulatory Visit (INDEPENDENT_AMBULATORY_CARE_PROVIDER_SITE_OTHER): Payer: Medicare HMO

## 2017-02-15 ENCOUNTER — Encounter: Payer: Self-pay | Admitting: *Deleted

## 2017-02-15 ENCOUNTER — Ambulatory Visit
Admission: EM | Admit: 2017-02-15 | Discharge: 2017-02-15 | Disposition: A | Discharge status: Home / Self Care | Payer: Medicare HMO | Attending: Family Medicine | Admitting: Family Medicine

## 2017-02-15 DIAGNOSIS — S61213A Laceration without foreign body of left middle finger without damage to nail, initial encounter: Secondary | ICD-10-CM | POA: Diagnosis not present

## 2017-02-15 DIAGNOSIS — M7989 Other specified soft tissue disorders: Secondary | ICD-10-CM | POA: Diagnosis not present

## 2017-02-15 DIAGNOSIS — S61211A Laceration without foreign body of left index finger without damage to nail, initial encounter: Secondary | ICD-10-CM

## 2017-02-15 DIAGNOSIS — M79645 Pain in left finger(s): Secondary | ICD-10-CM | POA: Diagnosis not present

## 2017-02-15 MED ORDER — LIDOCAINE HCL (PF) 1 % IJ SOLN: 5.0000 mL | Freq: Once | INTRAMUSCULAR | Status: DC

## 2017-02-15 NOTE — Discharge Instructions (Signed)
Keep clean as discussed. Monitor wound closely.  Sutures to be removed in 7-10 days as discussed.  Follow up with your primary care physician this week as needed. Return to Urgent care for new or worsening concerns.

## 2017-02-15 NOTE — ED Triage Notes (Signed)
Patient was cutting zip ties with his pocket knife and cut his left index and middle finger.

## 2017-02-15 NOTE — ED Provider Notes (Signed)
[facility] ____________________________________________  Time seen: Approximately 4:55 PM  I have reviewed the triage vital signs and the nursing notes.   HISTORY  Chief Complaint Extremity Laceration   HPI Norman Thompson is a 41 y.o. male presenting for evaluation of laceration to left second and third fingers that occurred just prior to arrival. Patient reports that he was trying to cut you sit tight off with a pocket knife in his hand slipped causing the laceration. Reports knife was clean. Denies concerns of foreign bodies. Denies other pain or injury. States mild to moderate pain directly at index finger laceration site. Denies decreased range of motion, paresthesias or fall. Denies head injury or loss of conscious. Reports tetanus immunization has been within the last 5 years. Denies other pain or injury.  Denies chest pain, shortness of breath,  or rash. Denies recent sickness. Denies recent antibiotic use. Reports chronic back pain and denies acute change.   Past Medical History:  Diagnosis Date  . Angioedema     Patient Active Problem List   Diagnosis Date Noted  . Back pain at L4-L5 level 07/22/2015  . DDD (degenerative disc disease), lumbosacral 07/22/2015  . Lumbar radiculopathy 07/22/2015  . Hx of lumbosacral spine surgery 07/22/2015  . History of lumbosacral spine surgery 07/22/2015    Past Surgical History:  Procedure Laterality Date  . BACK SURGERY       No current facility-administered medications for this encounter.   Current Outpatient Prescriptions:  .  cyclobenzaprine (FLEXERIL) 10 MG tablet, Take 10 mg by mouth 3 (three) times daily as needed for muscle spasms., Disp: , Rfl:  .  Melatonin 1 MG CAPS, Take by mouth at bedtime. 2-4 tabs, Disp: , Rfl:  .  oxyCODONE-acetaminophen (PERCOCET/ROXICET) 5-325 MG per tablet, Take 1 tablet by mouth every 6 (six) hours as needed for severe pain., Disp: 56 tablet, Rfl: 0 .  acetaminophen  (TYLENOL) 500 MG tablet, Take 500 mg by mouth 2 (two) times daily. 3 tabs, Disp: , Rfl:  .  ibuprofen (ADVIL,MOTRIN) 200 MG tablet, Take 200 mg by mouth every 6 (six) hours as needed. 3 tab prn, Disp: , Rfl:   Allergies Ciprofloxacin; Gabapentin; and Tape  Family History  Problem Relation Age of Onset  . Cancer Father   . Hypertension Father     Social History Social History  Substance Use Topics  . Smoking status: Never Smoker  . Smokeless tobacco: Never Used  . Alcohol use No    Review of Systems Constitutional: No fever/chills Cardiovascular: Denies chest pain. Respiratory: Denies shortness of breath. Gastrointestinal: No abdominal pain.   Genitourinary: Negative for dysuria. Skin: Negative for rash. As above.   ____________________________________________   PHYSICAL EXAM:  VITAL SIGNS: ED Triage Vitals  Enc Vitals Group     BP 02/15/17 1554 (!) 147/96     Pulse Rate 02/15/17 1554 91     Resp 02/15/17 1554 16     Temp 02/15/17 1554 98.7 F (37.1 C)     Temp Source 02/15/17 1554 Oral     SpO2 02/15/17 1554 98 %     Weight 02/15/17 1555 165 lb (74.8 kg)     Height 02/15/17 1555 5\' 8"  (1.727 m)     Head Circumference --      Peak Flow --      Pain Score 02/15/17 1555 8     Pain Loc --      Pain Edu? --      Excl. in  GC? --     Constitutional: Alert and oriented. Well appearing and in no acute distress. Eyes: Conjunctivae are normal. PERRL. EOMI. ENT      Head: Normocephalic and atraumatic. Hematological/Lymphatic/Immunilogical: No cervical lymphadenopathy. Cardiovascular: Normal rate, regular rhythm. Grossly normal heart sounds.  Good peripheral circulation. Respiratory: Normal respiratory effort without tachypnea nor retractions. Breath sounds are clear and equal bilaterally. No wheezes, rales, rhonchi. Neurologic:  Normal speech and language. Speech is normal. No gait instability.  Skin:  Skin is warm, dry and intact. No rash noted. Except:  Approximately 3.5 cm flap-like laceration present to left distal Palmer aspect of the second digit with mild active bleeding and mild to moderate tenderness to direct palpation, full range of motion present, no visualized foreign bodies, normal distal sensation and capillary refill. left third digit middle phalanx <1 cm superficial laceration present, nontender, not eating, well approximated, no apparent foreign bodies. Left hand otherwise nontender. Psychiatric: Mood and affect are normal. Speech and behavior are normal. Patient exhibits appropriate insight and judgment   ___________________________________________   LABS (all labs ordered are listed, but only abnormal results are displayed)  Labs Reviewed - No data to display ____________________________________________  RADIOLOGY  Dg Finger Index Left  Result Date: 02/15/2017 CLINICAL DATA:  Distal phalanx laceration and pain. Initial encounter. EXAM: LEFT INDEX FINGER 2+V COMPARISON:  None. FINDINGS: There is soft tissue swelling involving the palmar aspect of the index finger at the distal phalanx level. No radiopaque foreign body, fracture, or dislocation is seen. Bone mineralization appears normal. IMPRESSION: Soft tissue swelling without acute osseous abnormality. Electronically Signed   By: Logan Bores M.D.   On: 02/15/2017 17:22   ____________________________________________   PROCEDURES Procedures  Procedure(s) performed:  Procedure explained and verbal consent obtained. Consent: Verbal consent obtained. Written consent not obtained. Risks and benefits: risks, benefits and alternatives were discussed Patient identity confirmed: verbally with patient and hospital-assigned identification number  Consent given by: patient   Laceration Repair Location: Left index finger  Length: 3.5 cm Foreign bodies: no foreign bodies visualized Tendon involvement: none Nerve involvement: none Preparation: Patient was prepped and draped in  the usual sterile fashion. Anesthesia with 1% Lidocaine 5 mls Irrigation solution: saline and Betadine  Irrigation method: jet lavage Amount of cleaning: copious Repaired with 4-0 nylon  Number of sutures: 6 Technique: simple interrupted  Approximation: loose Patient tolerate well. Wound well approximated post repair.  Antibiotic ointment and dressing applied.  Wound care instructions provided.  Observe for any signs of infection or other problems.     INITIAL IMPRESSION / ASSESSMENT AND PLAN / ED COURSE  Pertinent labs & imaging results that were available during my care of the patient were reviewed by me and considered in my medical decision making (see chart for details).  Well-appearing patient. No acute distress. Presents for the complaints of laceration post mechanical injury that occurred at home just prior to arrival. Left index finger x-ray per radiologist, soft tissue swelling without acute osseous abnormality. Laceration copiously irrigated and wound repaired. Patient tolerated well. Discussed wound care and return for suture removal in 7-10 days. Discussed strict return parameters.  Discussed follow up with Primary care physician this week. Discussed follow up and return parameters including no resolution or any worsening concerns. Patient verbalized understanding and agreed to plan.   ____________________________________________   FINAL CLINICAL IMPRESSION(S) / ED DIAGNOSES  Final diagnoses:  Laceration of left index finger without foreign body without damage to nail, initial encounter  Laceration of  left middle finger without foreign body without damage to nail, initial encounter     Discharge Medication List as of 02/15/2017  6:10 PM      Note: This dictation was prepared with Dragon dictation along with smaller phrase technology. Any transcriptional errors that result from this process are unintentional.         Marylene Land, NP 02/15/17 [place], NP 02/15/17 2310

## 2017-02-27 DIAGNOSIS — G8929 Other chronic pain: Secondary | ICD-10-CM | POA: Diagnosis not present

## 2017-02-27 DIAGNOSIS — S61211D Laceration without foreign body of left index finger without damage to nail, subsequent encounter: Secondary | ICD-10-CM | POA: Diagnosis not present

## 2017-02-27 DIAGNOSIS — M5440 Lumbago with sciatica, unspecified side: Secondary | ICD-10-CM | POA: Diagnosis not present

## 2017-03-08 DIAGNOSIS — S61218D Laceration without foreign body of other finger without damage to nail, subsequent encounter: Secondary | ICD-10-CM | POA: Diagnosis not present

## 2017-03-08 DIAGNOSIS — C719 Malignant neoplasm of brain, unspecified: Secondary | ICD-10-CM | POA: Diagnosis not present

## 2017-03-08 DIAGNOSIS — G479 Sleep disorder, unspecified: Secondary | ICD-10-CM | POA: Diagnosis not present

## 2017-03-08 DIAGNOSIS — R6882 Decreased libido: Secondary | ICD-10-CM | POA: Diagnosis not present

## 2017-03-08 DIAGNOSIS — M549 Dorsalgia, unspecified: Secondary | ICD-10-CM | POA: Diagnosis not present

## 2017-03-08 DIAGNOSIS — C412 Malignant neoplasm of vertebral column: Secondary | ICD-10-CM | POA: Diagnosis not present

## 2017-03-08 DIAGNOSIS — M25562 Pain in left knee: Secondary | ICD-10-CM | POA: Diagnosis not present

## 2017-03-08 DIAGNOSIS — M961 Postlaminectomy syndrome, not elsewhere classified: Secondary | ICD-10-CM | POA: Diagnosis not present

## 2017-03-08 DIAGNOSIS — D497 Neoplasm of unspecified behavior of endocrine glands and other parts of nervous system: Secondary | ICD-10-CM | POA: Diagnosis not present

## 2017-03-08 DIAGNOSIS — R202 Paresthesia of skin: Secondary | ICD-10-CM | POA: Diagnosis not present

## 2017-03-08 DIAGNOSIS — G894 Chronic pain syndrome: Secondary | ICD-10-CM | POA: Diagnosis not present

## 2017-03-08 DIAGNOSIS — M25561 Pain in right knee: Secondary | ICD-10-CM | POA: Diagnosis not present

## 2017-03-08 DIAGNOSIS — G8929 Other chronic pain: Secondary | ICD-10-CM | POA: Diagnosis not present

## 2017-03-14 ENCOUNTER — Other Ambulatory Visit: Payer: Self-pay | Admitting: Neurosurgery

## 2017-03-14 DIAGNOSIS — D497 Neoplasm of unspecified behavior of endocrine glands and other parts of nervous system: Secondary | ICD-10-CM

## 2017-03-31 ENCOUNTER — Ambulatory Visit
Admission: RE | Admit: 2017-03-31 | Discharge: 2017-03-31 | Disposition: A | Discharge status: Home / Self Care | Payer: Medicare HMO | Source: Ambulatory Visit | Attending: Neurosurgery | Admitting: Neurosurgery

## 2017-03-31 DIAGNOSIS — Z9889 Other specified postprocedural states: Secondary | ICD-10-CM | POA: Diagnosis not present

## 2017-03-31 DIAGNOSIS — D497 Neoplasm of unspecified behavior of endocrine glands and other parts of nervous system: Secondary | ICD-10-CM | POA: Insufficient documentation

## 2017-03-31 DIAGNOSIS — M48061 Spinal stenosis, lumbar region without neurogenic claudication: Secondary | ICD-10-CM | POA: Diagnosis not present

## 2017-03-31 MED ORDER — GADOBENATE DIMEGLUMINE 529 MG/ML IV SOLN
15.0000 mL | Freq: Once | INTRAVENOUS | Status: AC | PRN
  Administered 2017-03-31: 15 mL via INTRAVENOUS

## 2017-05-18 DIAGNOSIS — I10 Essential (primary) hypertension: Secondary | ICD-10-CM | POA: Diagnosis not present

## 2017-05-18 DIAGNOSIS — D497 Neoplasm of unspecified behavior of endocrine glands and other parts of nervous system: Secondary | ICD-10-CM | POA: Diagnosis not present

## 2017-05-18 DIAGNOSIS — Z6825 Body mass index (BMI) 25.0-25.9, adult: Secondary | ICD-10-CM | POA: Diagnosis not present

## 2017-06-21 DIAGNOSIS — M25562 Pain in left knee: Secondary | ICD-10-CM | POA: Diagnosis not present

## 2017-06-21 DIAGNOSIS — M549 Dorsalgia, unspecified: Secondary | ICD-10-CM | POA: Diagnosis not present

## 2017-06-21 DIAGNOSIS — G8929 Other chronic pain: Secondary | ICD-10-CM | POA: Diagnosis not present

## 2017-06-21 DIAGNOSIS — M25561 Pain in right knee: Secondary | ICD-10-CM | POA: Diagnosis not present

## 2017-06-21 DIAGNOSIS — C719 Malignant neoplasm of brain, unspecified: Secondary | ICD-10-CM | POA: Diagnosis not present

## 2017-06-21 DIAGNOSIS — D497 Neoplasm of unspecified behavior of endocrine glands and other parts of nervous system: Secondary | ICD-10-CM | POA: Diagnosis not present

## 2017-06-21 DIAGNOSIS — M961 Postlaminectomy syndrome, not elsewhere classified: Secondary | ICD-10-CM | POA: Diagnosis not present

## 2017-06-21 DIAGNOSIS — G894 Chronic pain syndrome: Secondary | ICD-10-CM | POA: Diagnosis not present

## 2017-09-25 DIAGNOSIS — M25561 Pain in right knee: Secondary | ICD-10-CM | POA: Diagnosis not present

## 2017-09-25 DIAGNOSIS — M25562 Pain in left knee: Secondary | ICD-10-CM | POA: Diagnosis not present

## 2017-09-25 DIAGNOSIS — G8929 Other chronic pain: Secondary | ICD-10-CM | POA: Diagnosis not present

## 2017-09-25 DIAGNOSIS — M961 Postlaminectomy syndrome, not elsewhere classified: Secondary | ICD-10-CM | POA: Diagnosis not present

## 2017-09-25 DIAGNOSIS — M549 Dorsalgia, unspecified: Secondary | ICD-10-CM | POA: Diagnosis not present

## 2017-09-25 DIAGNOSIS — G894 Chronic pain syndrome: Secondary | ICD-10-CM | POA: Diagnosis not present

## 2017-09-25 DIAGNOSIS — C719 Malignant neoplasm of brain, unspecified: Secondary | ICD-10-CM | POA: Diagnosis not present

## 2017-09-25 DIAGNOSIS — D497 Neoplasm of unspecified behavior of endocrine glands and other parts of nervous system: Secondary | ICD-10-CM | POA: Diagnosis not present

## 2017-10-02 DIAGNOSIS — R6882 Decreased libido: Secondary | ICD-10-CM | POA: Diagnosis not present

## 2017-10-02 DIAGNOSIS — Z0000 Encounter for general adult medical examination without abnormal findings: Secondary | ICD-10-CM | POA: Diagnosis not present

## 2017-10-02 DIAGNOSIS — Z125 Encounter for screening for malignant neoplasm of prostate: Secondary | ICD-10-CM | POA: Diagnosis not present

## 2017-10-02 DIAGNOSIS — Z79899 Other long term (current) drug therapy: Secondary | ICD-10-CM | POA: Diagnosis not present

## 2017-12-19 DIAGNOSIS — C719 Malignant neoplasm of brain, unspecified: Secondary | ICD-10-CM | POA: Diagnosis not present

## 2017-12-19 DIAGNOSIS — G894 Chronic pain syndrome: Secondary | ICD-10-CM | POA: Diagnosis not present

## 2017-12-19 DIAGNOSIS — M25561 Pain in right knee: Secondary | ICD-10-CM | POA: Diagnosis not present

## 2017-12-19 DIAGNOSIS — D497 Neoplasm of unspecified behavior of endocrine glands and other parts of nervous system: Secondary | ICD-10-CM | POA: Diagnosis not present

## 2017-12-19 DIAGNOSIS — M549 Dorsalgia, unspecified: Secondary | ICD-10-CM | POA: Diagnosis not present

## 2017-12-19 DIAGNOSIS — M25562 Pain in left knee: Secondary | ICD-10-CM | POA: Diagnosis not present

## 2017-12-19 DIAGNOSIS — G8929 Other chronic pain: Secondary | ICD-10-CM | POA: Diagnosis not present

## 2017-12-19 DIAGNOSIS — M961 Postlaminectomy syndrome, not elsewhere classified: Secondary | ICD-10-CM | POA: Diagnosis not present

## 2018-01-10 DIAGNOSIS — J0100 Acute maxillary sinusitis, unspecified: Secondary | ICD-10-CM | POA: Diagnosis not present

## 2018-01-10 DIAGNOSIS — Z9889 Other specified postprocedural states: Secondary | ICD-10-CM | POA: Diagnosis not present

## 2018-03-20 DIAGNOSIS — Z79891 Long term (current) use of opiate analgesic: Secondary | ICD-10-CM | POA: Diagnosis not present

## 2018-03-20 DIAGNOSIS — Z7951 Long term (current) use of inhaled steroids: Secondary | ICD-10-CM | POA: Diagnosis not present

## 2018-03-20 DIAGNOSIS — G8929 Other chronic pain: Secondary | ICD-10-CM | POA: Diagnosis not present

## 2018-03-20 DIAGNOSIS — M961 Postlaminectomy syndrome, not elsewhere classified: Secondary | ICD-10-CM | POA: Diagnosis not present

## 2018-03-20 DIAGNOSIS — M25561 Pain in right knee: Secondary | ICD-10-CM | POA: Diagnosis not present

## 2018-03-20 DIAGNOSIS — M549 Dorsalgia, unspecified: Secondary | ICD-10-CM | POA: Diagnosis not present

## 2018-03-20 DIAGNOSIS — G8928 Other chronic postprocedural pain: Secondary | ICD-10-CM | POA: Diagnosis not present

## 2018-03-20 DIAGNOSIS — D497 Neoplasm of unspecified behavior of endocrine glands and other parts of nervous system: Secondary | ICD-10-CM | POA: Diagnosis not present

## 2018-03-20 DIAGNOSIS — Z79899 Other long term (current) drug therapy: Secondary | ICD-10-CM | POA: Diagnosis not present

## 2018-03-20 DIAGNOSIS — M25562 Pain in left knee: Secondary | ICD-10-CM | POA: Diagnosis not present

## 2018-03-20 DIAGNOSIS — G894 Chronic pain syndrome: Secondary | ICD-10-CM | POA: Diagnosis not present

## 2018-03-20 DIAGNOSIS — C719 Malignant neoplasm of brain, unspecified: Secondary | ICD-10-CM | POA: Diagnosis not present

## 2018-06-22 DIAGNOSIS — J0100 Acute maxillary sinusitis, unspecified: Secondary | ICD-10-CM | POA: Diagnosis not present

## 2018-07-24 DIAGNOSIS — D497 Neoplasm of unspecified behavior of endocrine glands and other parts of nervous system: Secondary | ICD-10-CM | POA: Diagnosis not present

## 2018-07-24 DIAGNOSIS — M549 Dorsalgia, unspecified: Secondary | ICD-10-CM | POA: Diagnosis not present

## 2018-07-24 DIAGNOSIS — M961 Postlaminectomy syndrome, not elsewhere classified: Secondary | ICD-10-CM | POA: Diagnosis not present

## 2018-07-24 DIAGNOSIS — G894 Chronic pain syndrome: Secondary | ICD-10-CM | POA: Diagnosis not present

## 2018-07-24 DIAGNOSIS — M25561 Pain in right knee: Secondary | ICD-10-CM | POA: Diagnosis not present

## 2018-07-24 DIAGNOSIS — C719 Malignant neoplasm of brain, unspecified: Secondary | ICD-10-CM | POA: Diagnosis not present

## 2018-07-24 DIAGNOSIS — G8929 Other chronic pain: Secondary | ICD-10-CM | POA: Diagnosis not present

## 2018-07-24 DIAGNOSIS — M25562 Pain in left knee: Secondary | ICD-10-CM | POA: Diagnosis not present

## 2018-08-06 DIAGNOSIS — J0100 Acute maxillary sinusitis, unspecified: Secondary | ICD-10-CM | POA: Diagnosis not present

## 2018-10-08 DIAGNOSIS — Z79899 Other long term (current) drug therapy: Secondary | ICD-10-CM | POA: Diagnosis not present

## 2018-10-08 DIAGNOSIS — R7989 Other specified abnormal findings of blood chemistry: Secondary | ICD-10-CM | POA: Diagnosis not present

## 2018-10-08 DIAGNOSIS — Z0000 Encounter for general adult medical examination without abnormal findings: Secondary | ICD-10-CM | POA: Diagnosis not present

## 2018-10-08 DIAGNOSIS — R739 Hyperglycemia, unspecified: Secondary | ICD-10-CM | POA: Diagnosis not present

## 2018-10-08 DIAGNOSIS — Z125 Encounter for screening for malignant neoplasm of prostate: Secondary | ICD-10-CM | POA: Diagnosis not present

## 2018-11-06 DIAGNOSIS — M25561 Pain in right knee: Secondary | ICD-10-CM | POA: Diagnosis not present

## 2018-11-06 DIAGNOSIS — M25562 Pain in left knee: Secondary | ICD-10-CM | POA: Diagnosis not present

## 2018-11-06 DIAGNOSIS — G894 Chronic pain syndrome: Secondary | ICD-10-CM | POA: Diagnosis not present

## 2018-11-06 DIAGNOSIS — M961 Postlaminectomy syndrome, not elsewhere classified: Secondary | ICD-10-CM | POA: Diagnosis not present

## 2018-11-06 DIAGNOSIS — M549 Dorsalgia, unspecified: Secondary | ICD-10-CM | POA: Diagnosis not present

## 2018-11-06 DIAGNOSIS — G8929 Other chronic pain: Secondary | ICD-10-CM | POA: Diagnosis not present

## 2018-11-06 DIAGNOSIS — C719 Malignant neoplasm of brain, unspecified: Secondary | ICD-10-CM | POA: Diagnosis not present

## 2018-11-06 DIAGNOSIS — D497 Neoplasm of unspecified behavior of endocrine glands and other parts of nervous system: Secondary | ICD-10-CM | POA: Diagnosis not present

## 2019-08-04 ENCOUNTER — Emergency Department: Payer: Medicare HMO

## 2019-08-04 ENCOUNTER — Emergency Department
Admission: EM | Admit: 2019-08-04 | Discharge: 2019-08-04 | Disposition: A | Discharge status: Home / Self Care | Payer: Medicare HMO | Attending: Student | Admitting: Student

## 2019-08-04 ENCOUNTER — Other Ambulatory Visit: Payer: Self-pay

## 2019-08-04 DIAGNOSIS — S161XXA Strain of muscle, fascia and tendon at neck level, initial encounter: Secondary | ICD-10-CM | POA: Diagnosis not present

## 2019-08-04 DIAGNOSIS — Y999 Unspecified external cause status: Secondary | ICD-10-CM | POA: Insufficient documentation

## 2019-08-04 DIAGNOSIS — Y9389 Activity, other specified: Secondary | ICD-10-CM | POA: Insufficient documentation

## 2019-08-04 DIAGNOSIS — Z79899 Other long term (current) drug therapy: Secondary | ICD-10-CM | POA: Insufficient documentation

## 2019-08-04 DIAGNOSIS — M5412 Radiculopathy, cervical region: Secondary | ICD-10-CM | POA: Insufficient documentation

## 2019-08-04 DIAGNOSIS — Y92410 Unspecified street and highway as the place of occurrence of the external cause: Secondary | ICD-10-CM | POA: Diagnosis not present

## 2019-08-04 DIAGNOSIS — S199XXA Unspecified injury of neck, initial encounter: Secondary | ICD-10-CM | POA: Diagnosis present

## 2019-08-04 MED ORDER — MELOXICAM 7.5 MG PO TABS
15.0000 mg | ORAL_TABLET | Freq: Once | ORAL | Status: AC
  Administered 2019-08-04: 15 mg via ORAL
  Filled 2019-08-04: qty 2

## 2019-08-04 MED ORDER — OXYCODONE-ACETAMINOPHEN 5-325 MG PO TABS
1.0000 | ORAL_TABLET | Freq: Once | ORAL | Status: AC
  Administered 2019-08-04: 1 via ORAL
  Filled 2019-08-04: qty 1

## 2019-08-04 MED ORDER — METHOCARBAMOL 500 MG PO TABS
1000.0000 mg | ORAL_TABLET | Freq: Once | ORAL | Status: AC
  Administered 2019-08-04: 1000 mg via ORAL
  Filled 2019-08-04: qty 2

## 2019-08-04 MED ORDER — METHOCARBAMOL 500 MG PO TABS: 500.0000 mg | ORAL_TABLET | Freq: Four times a day (QID) | ORAL | 0 refills | Status: DC

## 2019-08-04 MED ORDER — MELOXICAM 15 MG PO TABS: 15.0000 mg | ORAL_TABLET | Freq: Every day | ORAL | 0 refills | Status: DC

## 2019-08-04 NOTE — ED Notes (Signed)
Patient transported to CT 
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
## 2019-08-04 NOTE — ED Notes (Signed)
Patient transported to X-ray 
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
## 2023-02-18 ENCOUNTER — Ambulatory Visit (INDEPENDENT_AMBULATORY_CARE_PROVIDER_SITE_OTHER): Payer: Medicare HMO

## 2023-02-18 ENCOUNTER — Ambulatory Visit
Admission: EM | Admit: 2023-02-18 | Discharge: 2023-02-18 | Disposition: A | Discharge status: Home / Self Care | Payer: Medicare HMO | Attending: Family Medicine | Admitting: Family Medicine

## 2023-02-18 ENCOUNTER — Encounter: Payer: Self-pay | Admitting: Emergency Medicine

## 2023-02-18 DIAGNOSIS — M898X1 Other specified disorders of bone, shoulder: Secondary | ICD-10-CM | POA: Diagnosis not present

## 2023-02-18 DIAGNOSIS — M5030 Other cervical disc degeneration, unspecified cervical region: Secondary | ICD-10-CM

## 2023-02-18 DIAGNOSIS — S161XXA Strain of muscle, fascia and tendon at neck level, initial encounter: Secondary | ICD-10-CM | POA: Diagnosis not present

## 2023-02-18 MED ORDER — METHOCARBAMOL 500 MG PO TABS: 500.0000 mg | ORAL_TABLET | Freq: Three times a day (TID) | ORAL | 0 refills | Status: AC | PRN

## 2023-02-18 MED ORDER — IBUPROFEN 600 MG PO TABS
600.0000 mg | ORAL_TABLET | Freq: Once | ORAL | Status: AC
  Administered 2023-02-18: 600 mg via ORAL

## 2023-02-18 MED ORDER — NAPROXEN 500 MG PO TABS: 500.0000 mg | ORAL_TABLET | Freq: Two times a day (BID) | ORAL | 0 refills | Status: AC

## 2023-02-18 MED ORDER — METHOCARBAMOL 500 MG PO TABS: 500.0000 mg | ORAL_TABLET | Freq: Three times a day (TID) | ORAL | 0 refills | Status: DC | PRN

## 2023-02-18 MED ORDER — NAPROXEN 500 MG PO TABS: 500.0000 mg | ORAL_TABLET | Freq: Two times a day (BID) | ORAL | 0 refills | Status: DC

## 2023-02-18 MED ORDER — METHOCARBAMOL 500 MG PO TABS: 500.0000 mg | ORAL_TABLET | Freq: Four times a day (QID) | ORAL | 0 refills | Status: DC
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
## 2023-05-10 LAB — COLOGUARD: COLOGUARD: NEGATIVE

## 2024-04-09 ENCOUNTER — Inpatient Hospital Stay

## 2024-04-09 ENCOUNTER — Encounter: Payer: Self-pay | Admitting: Licensed Clinical Social Worker

## 2024-04-09 ENCOUNTER — Other Ambulatory Visit: Payer: Self-pay | Admitting: Licensed Clinical Social Worker

## 2024-04-09 ENCOUNTER — Inpatient Hospital Stay: Attending: Oncology | Admitting: Licensed Clinical Social Worker

## 2024-04-09 DIAGNOSIS — Z8042 Family history of malignant neoplasm of prostate: Secondary | ICD-10-CM

## 2024-04-09 DIAGNOSIS — Z8481 Family history of carrier of genetic disease: Secondary | ICD-10-CM

## 2024-04-09 DIAGNOSIS — Z803 Family history of malignant neoplasm of breast: Secondary | ICD-10-CM

## 2024-04-09 DIAGNOSIS — Z800 Family history of malignant neoplasm of digestive organs: Secondary | ICD-10-CM

## 2024-04-09 LAB — GENETIC SCREENING ORDER

## 2024-04-09 NOTE — Progress Notes (Signed)
 REFERRING PROVIDER: Self-referred  PRIMARY PROVIDER:  Nestor Banter, MD  PRIMARY REASON FOR VISIT:  1. Family history of BRCA2 gene positive   2. Family history of breast cancer   3. Family history of prostate cancer   4. Family history of pancreatic cancer      HISTORY OF PRESENT ILLNESS:   Mr. Norman Thompson, a 48 y.o. male, was seen for a [place] cancer genetics consultation due to his sister's recent genetic testing that showed a BRCA2 pathogenic variant (c.1813dupA ([id number]).  Mr. Diaz presents to clinic today to discuss the possibility of a hereditary predisposition to cancer, genetic testing, and to further clarify his future cancer risks, as well as potential cancer risks for family members.    CANCER HISTORY:  Mr. Minturn is a 48 y.o. male with no personal history of cancer.   He reports having colonoscopy in his 30s and cologuard last year that was normal.  He is up to date on prostate cancer screening.   Past Medical History:  Diagnosis Date   Angioedema     Past Surgical History:  Procedure Laterality Date   BACK SURGERY      FAMILY HISTORY:  We obtained a detailed, 4-generation family history.  Significant diagnoses are listed below: Family History  Problem Relation Age of Onset   Prostate cancer Father 61       metastatic   Hypertension Father    Breast cancer Sister 28       BRCA2+   Pancreatic cancer Maternal Grandfather    Mr. Waymire has 2 sons, 22 and 67 and 2 daughters, 56 and 21. He has 1 brother and 1 sister. His sister was recently diagnosed with breast cancer at 83 and found to have a BRCA2 pathogenic variant.   Mr. Navarra father had metastatic prostate cancer and passed at 43. A few of his half siblings had lung cancer/possibly other cancers, limited information.   Mr. Odriscoll mother is living at 65. Maternal grandfather died of pancreatic cancer.   Mr. Frangos is aware of previous family history of genetic testing for hereditary cancer risks. There  is no reported Ashkenazi Jewish ancestry. There is no known consanguinity.    GENETIC COUNSELING ASSESSMENT: Mr. Hagood is a 48 y.o. male with a family history of a BRCA2 pathogenic variant.  We, therefore, discussed and recommended the following at today's visit.   DISCUSSION: We discussed that approximately 10% of cancer is hereditary. We discussed the BRCA2 gene in particular noting cancer risks and potential management changes associated. He has a 50% chance to have inherited this BRCA2 pathogenic variant. However, it is unclear which parent this pathogenic variant is coming from. There are other genes associated with hereditary cancer as well that we could test since he would technically meet criteria on both sides of his family. Cancers and risks are gene specific. We discussed that testing is beneficial for several reasons including knowing about cancer risks, identifying potential screening and risk-reduction options that may be appropriate, and to understand if other family members could be at risk for cancer and allow them to undergo genetic testing.   We reviewed the characteristics, features and inheritance patterns of hereditary cancer syndromes. We also discussed genetic testing, including the appropriate family members to test, the process of testing, insurance coverage and turn-around-time for results. We discussed the implications of a negative, positive and/or variant of uncertain significant result. We recommended Mr. Hanselman pursue genetic testing for the BRCA2 known familial pathogenic variant or the  CancerNext+RNA gene panel. Patient elected to do testing for the BRCA2 gene only.  Based on Mr. Goodrich family history of cancer and family history of BRCA2 pathogenic variant, he meets medical criteria for genetic testing. His testing will be at no cost through [facility] family variant testing program.  PLAN: After considering the risks, benefits, and limitations, Mr. Tanzi provided informed  consent to pursue genetic testing and the blood sample was sent to [facility] for analysis of the BRCA2 known familial pathogenic variant.. Results should be available within approximately 3 weeks' time, at which point they will be disclosed by telephone to Mr. Youngblood, as will any additional recommendations warranted by these results. Mr. Wigger will receive a summary of his genetic counseling visit and a copy of his results once available. This information will also be available in Epic.   Mr. Beverlin questions were answered to his satisfaction today. Our contact information was provided should additional questions or concerns arise. Thank you for the referral and allowing us  to share in the care of your patient.   Valri Gee, MS, [facility] Genetic Counselor [email or web address] Phone: [phone]  30 minutes were spent on the date of the encounter in service to the patient including preparation, face-to-face consultation, documentation and care coordination. Dr. Nelson Bandy was available for discussion regarding this case.   _______________________________________________________________________ For Office Staff:  Number of people involved in session: 1 Was an Intern/ student involved with case: no

## 2024-04-25 ENCOUNTER — Telehealth: Payer: Self-pay | Admitting: Licensed Clinical Social Worker

## 2024-04-29 NOTE — Telephone Encounter (Signed)
 I contacted Norman Thompson to discuss his genetic testing results. 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 Detailed clinic note to follow.   The test report has been scanned into EPIC and is located under the Molecular Pathology section of the Results Review tab.  A portion of the result report is included below for reference.      Dena Cary, MS, [facility] Genetic Counselor [email or web address] Phone: [phone]

## 2024-04-30 ENCOUNTER — Encounter: Payer: Self-pay | Admitting: Licensed Clinical Social Worker

## 2024-04-30 ENCOUNTER — Ambulatory Visit: Payer: Self-pay | Admitting: Licensed Clinical Social Worker

## 2024-04-30 DIAGNOSIS — Z1501 Genetic susceptibility to malignant neoplasm of breast: Secondary | ICD-10-CM

## 2024-04-30 DIAGNOSIS — Z1379 Encounter for other screening for genetic and chromosomal anomalies: Secondary | ICD-10-CM

## 2024-04-30 NOTE — Progress Notes (Signed)
 Genetic Test Results - BRCA2+  HPI:   Norman Thompson was previously seen in the [facility] clinic due to his sister's recent genetic testing that showed a BRCA2 pathogenic mutation.  Please refer to our prior cancer genetics clinic note for more information regarding our discussion, assessment and recommendations, at the time. Mr. Fearn recent genetic test results were disclosed to him, as were recommendations warranted by these results. These results and recommendations are discussed in more detail below.  CANCER HISTORY:  Oncology History   No history exists.    FAMILY HISTORY:  We obtained a detailed, 4-generation family history.  Significant diagnoses are listed below: Family History  Problem Relation Age of Onset   Prostate cancer Father 40       metastatic   Hypertension Father    Breast cancer Sister 78       BRCA2+   Pancreatic cancer Maternal Grandfather    Mr. Postlewait has 2 sons, 8 and 95 and 2 daughters, 25 and 63. He has 1 brother and 1 sister. His sister was recently diagnosed with breast cancer at 50 and found to have a BRCA2 pathogenic variant.    Mr. Holland father had metastatic prostate cancer and passed at 88. A few of his half siblings had lung cancer/possibly other cancers, limited information.    Mr. Steinke mother is living at 69. Maternal grandfather died of pancreatic cancer.    Mr. Granville is aware of previous family history of genetic testing for hereditary cancer risks. There is no reported Ashkenazi Jewish ancestry. There is no known consanguinity.       GENETIC TESTING: Mr. Graybeal tested positive for a single pathogenic variant (harmful genetic change) in the BRCA2 gene. Specifically, this variant is c.1813dupA.  The test report has been scanned into EPIC and is located under the Molecular Pathology section of the Results Review tab.  A portion of the result report is included below for reference. Genetic testing reported out on 04/23/2024.      Clinical Information: Hereditary breast and ovarian cancer (HBOC) syndrome is characterized by an increased lifetime risk for, generally, adult-onset cancers including, breast, contralateral breast, male breast, ovarian, prostate, melanoma and pancreatic.  The cancers associated with BRCA2 are: Male breast cancer, up to an 84% risk In women with a history of breast cancer, the risk for contralateral breast cancer 10 years after breast cancer diagnosis is 10-30%.  Male breast cancer, up to an 8% risk Ovarian cancer, 13-29% risk Pancreatic cancer, 5-10% risk Prostate cancer, 19-61% risk Melanoma, elevated risk   Management Recommendations:  Breast Screening/Risk Reduction:  Women: Breast awareness starting at age 37 Clinical breast examination every 6-12 months starting at age 12  Breast cancer screening: Age 36-29 years, annual breast MRI with and without contrast (or mammogram, if MRI is unavailable), although the age to initiate screening may be individualized based on family history Age 72-75 years, annual mammogram and breast MRI with and without contrast Age >75 years, management should be considered on an individual basis For women with a BRCA2 pathogenic or likely pathogenic variant who are treated for breast cancer and have not had a bilateral mastectomy, screening with annual mammogram and breast MRI should continue as described above. The option of prophylactic bilateral risk-reducing mastectomy (RRM), removal of the breast tissue before cancer develops, is the best option for significantly decreasing the risk of developing breast cancer. Studies have shown mastectomies reduce the risk of breast cancer by 90-95% in women with  a BRCA2 mutation.   Males: Breast self-exam training and education starting at age 26 years Annual clinical breast exam starting at age 48 years  Consider annual mammogram starting at age 53 or 10 years before the earliest known male breast cancer  in the family (whichever comes first).   Gynecological Cancer Screening/Risk Reduction: It is recommended that women with a BRCA2 mutation have a risk-reducing salpingo oophorectomy (RRSO), removal of the ovaries and fallopian tubes. It is reasonable to delay RRSO until age 54-45 years unless age at diagnosis in the family warrants earlier age for consideration of RRSO.  Having a RRSO is estimated to reduce the risk of ovarian cancer by up to 96%. There is still a small risk of developing an ovarian-like cancer in the lining of the abdomen, called the peritoneum. Another benefit to having the ovaries removed is the risk reduction for breast cancer. If the ovaries are removed before menopause, the risk of developing breast cancer is reduced. Ovarian cancer screening is an option for women who chose not to have a RRSO or who have not yet completed their family. Current screening methods for ovarian cancer are neither sensitive nor specific, meaning that often early stage ovarian cancer cannot be diagnosed through this screening.  Screening can also be falsely positive with no cancer present. For this reason, RRSO is recommended over screening. If ovarian cancer screening is recommended by a physician, it could include: CA-125 blood tests Transvaginal ultrasounds Clinical pelvic exams   Skin Cancer Screening and Risk Reduction: Regular skin self-examinations Individuals should notify their physicians of any changes to moles such as increasing in size, darkening in color, or other change in appearance. Annual skin examinations by a dermatologist  Follow sun-safety recommendations such as: Using UVA and UVB 30 SPF or higher sunscreen Avoiding sunburns Limiting sun exposure, especially during the hours of 11am-4pm  Wearing protective clothing and sunglasses Avoid using tanning beds For more information about the prevention of melanoma visit [email or web address]   Prostate Cancer Screening: Annual  digital rectal exam (DRE) at age 45 Annual PSA blood test at age 54  Pancreatic Cancer Screening/Risk Reduction: Consider pancreatic cancer screening beginning at age 18 (or 32 years younger than earliest exocrine pancreatic cancer diagnosis in the family, whichever is earlier) For individuals considering pancreatic cancer screening, the Panel recommends that screening be performed in experienced high-volume centers. The Panel recommends that such screening only take place after an in-depth discussion about the potential limitations to screening,  including cost, the high incidence of benign or indeterminate pancreatic abnormalities, and uncertainties about the potential benefits of  pancreatic cancer screening.  Consider screening using annual contrast-enhanced MRI/magnetic resonance cholangiopancreatography (MRCP) and/or endoscopic ultrasound (EUS), with consideration of shorter screening intervals, based on clinical judgment, for individuals found to have potentially concerning abnormalities on screening. Studies have typically started screening with contrast-enhanced MRCP and/or EUS in individuals at increased risk for pancreatic cancer. The Panel emphasizes that most small cystic lesions found on screening will not warrant biopsy, surgical resection, or any other intervention  Additional Considerations: Individuals at risk for developing breast and ovarian cancer may benefit from the use of medication to reduce their risk for cancer. These medications are referred to as chemoprevention. For example, oral contraceptive use has been shown to reduce the risk of ovarian cancer by approximately 60% in BRCA2 mutation carriers if taken for at least 5 years. This risk reduction remains even after discontinuation of oral contraceptives. Recent studies have suggested PARP inhibitors may  be a beneficial chemotherapeutic agent for a subset of patients with BRCA2-associated breast, ovarian, prostate, and  pancreatic cancers. Clinical trials are currently in process to determine if and how these agents can be useful in the treatment of BRCA2 cancer patients Patients of reproductive age should be made aware of options for prenatal diagnosis and assisted reproduction including pre-implantation genetic diagnosis. Individuals with a single pathogenic BRCA2 variant are carriers of Fanconi anemia. Fanconi anemia is characterized by developmental delay apparent from infancy, short stature, microcephaly, and coarse dysmorphic features. For there to be a risk of Fanconi anemia in offspring, both the patient and their partner would each have to carry a pathogenic variant in BRCA2. In this case, the risk of having an affected child is 25%.   This information is based on current understanding of the gene and may change in the future. Guidelines from NCCN Genetic/Familial High-Risk Assessment: Breast, Ovarian, Pancreatic, Prostate Cancer v.2.2025.  Implications for Family Members: Hereditary predisposition to cancer due to pathogenic variants in the BRCA2 gene has autosomal dominant inheritance. This means that an individual with a pathogenic variant has a 50% chance of passing the condition on to his/her offspring. Identification of a pathogenic variant allows for the recognition of at-risk relatives who can pursue testing for the familial variant.  Family members are encouraged to consider genetic testing for this familial pathogenic variant. As there are generally no childhood cancer risks associated with a single pathogenic variant in the BRCA2 gene, individuals in the family are not recommended to have testing until they reach at least 50 years of age. They may contact our office at [phone] for more information or to schedule an appointment. Family members who live outside of the area are encouraged to find a genetic counselor in their area by visiting:  [email or web address].  Resources: FORCE (Facing Our Risk of Cancer Empowered) is a resource for those with a hereditary predisposition to develop cancer.  FORCE provides information about risk reduction, advocacy, legislation, and clinical trials.  Additionally, FORCE provides a platform for collaboration and support; which includes: peer navigation, message boards, local support groups, a toll-free helpline, research registry and recruitment, advocate training, published medical research, webinars, brochures, mastectomy photos, and more.  For more information, visit www.facingourrisk.org  PLAN: 1. These results will be made available to  Mr. Stfort primary care provider, Dr. Diedra. He would like Dr. Diedra to follow him long-term for this indication and coordinate screening. Referral placed to  GI to discuss pancreatic cancer screening.  2. Mr. Keetch plans to discuss these results with his family and will reach out to us  if we can be of any assistance in coordinating genetic testing for any relatives.     We encouraged Mr. Pizzini to remain in contact with us  on an annual basis so we can update his personal and family histories, and let him know of advances in cancer genetics that may benefit the family. Our contact number was provided. Mr. Hoskin questions were answered to his satisfaction today, and he knows he is welcome to call anytime with additional questions.    Dena Cary, MS, [facility] Genetic Counselor [email or web address] Phone: [phone]
